# Patient Record
Sex: Male | Born: 1970 | Race: White | Hispanic: No | Marital: Married | Smoking: Current every day smoker
Health system: Southern US, Community
[De-identification: ages and names within clinical notes are randomized; demographics above are authoritative.]

## PROBLEM LIST (undated history)

## (undated) DIAGNOSIS — L409 Psoriasis, unspecified: Secondary | ICD-10-CM

## (undated) DIAGNOSIS — I10 Essential (primary) hypertension: Secondary | ICD-10-CM

## (undated) DIAGNOSIS — F419 Anxiety disorder, unspecified: Secondary | ICD-10-CM

---

## 2008-11-09 ENCOUNTER — Ambulatory Visit (HOSPITAL_COMMUNITY): Admission: RE | Admit: 2008-11-09 | Discharge: 2008-11-09 | Payer: Self-pay | Admitting: Dermatology

## 2009-12-18 ENCOUNTER — Emergency Department (HOSPITAL_BASED_OUTPATIENT_CLINIC_OR_DEPARTMENT_OTHER): Admission: EM | Admit: 2009-12-18 | Discharge: 2009-12-18 | Payer: Self-pay | Admitting: Emergency Medicine

## 2009-12-18 ENCOUNTER — Ambulatory Visit: Payer: Self-pay | Admitting: Diagnostic Radiology

## 2012-01-21 ENCOUNTER — Encounter (HOSPITAL_BASED_OUTPATIENT_CLINIC_OR_DEPARTMENT_OTHER): Payer: Self-pay | Admitting: *Deleted

## 2012-01-21 ENCOUNTER — Emergency Department (HOSPITAL_BASED_OUTPATIENT_CLINIC_OR_DEPARTMENT_OTHER): Payer: Worker's Compensation

## 2012-01-21 ENCOUNTER — Emergency Department (HOSPITAL_BASED_OUTPATIENT_CLINIC_OR_DEPARTMENT_OTHER)
Admission: EM | Admit: 2012-01-21 | Discharge: 2012-01-21 | Disposition: A | Discharge status: Home / Self Care | Payer: Worker's Compensation | Attending: Emergency Medicine | Admitting: Emergency Medicine

## 2012-01-21 DIAGNOSIS — S92919A Unspecified fracture of unspecified toe(s), initial encounter for closed fracture: Secondary | ICD-10-CM | POA: Insufficient documentation

## 2012-01-21 DIAGNOSIS — W230XXA Caught, crushed, jammed, or pinched between moving objects, initial encounter: Secondary | ICD-10-CM | POA: Insufficient documentation

## 2012-01-21 DIAGNOSIS — S92901A Unspecified fracture of right foot, initial encounter for closed fracture: Secondary | ICD-10-CM

## 2012-01-21 DIAGNOSIS — F17200 Nicotine dependence, unspecified, uncomplicated: Secondary | ICD-10-CM | POA: Insufficient documentation

## 2012-01-21 DIAGNOSIS — I10 Essential (primary) hypertension: Secondary | ICD-10-CM | POA: Insufficient documentation

## 2012-01-21 DIAGNOSIS — S92309A Fracture of unspecified metatarsal bone(s), unspecified foot, initial encounter for closed fracture: Secondary | ICD-10-CM | POA: Insufficient documentation

## 2012-01-21 DIAGNOSIS — R110 Nausea: Secondary | ICD-10-CM | POA: Insufficient documentation

## 2012-01-21 DIAGNOSIS — S91309A Unspecified open wound, unspecified foot, initial encounter: Secondary | ICD-10-CM | POA: Insufficient documentation

## 2012-01-21 HISTORY — DX: Essential (primary) hypertension: I10

## 2012-01-21 HISTORY — DX: Anxiety disorder, unspecified: F41.9

## 2012-01-21 MED ORDER — LIDOCAINE-EPINEPHRINE 2 %-1:100000 IJ SOLN
INTRAMUSCULAR | Status: AC
  Administered 2012-01-21: 1 mL
  Filled 2012-01-21: qty 1

## 2012-01-21 MED ORDER — IBUPROFEN 600 MG PO TABS: 600.0000 mg | ORAL_TABLET | Freq: Four times a day (QID) | ORAL | Status: AC | PRN

## 2012-01-21 MED ORDER — LIDOCAINE-EPINEPHRINE 2 %-1:100000 IJ SOLN
2.0000 mL | Freq: Once | INTRAMUSCULAR | Status: AC
  Administered 2012-01-21: 1 mL

## 2012-01-21 MED ORDER — KETOROLAC TROMETHAMINE 30 MG/ML IJ SOLN
30.0000 mg | Freq: Once | INTRAMUSCULAR | Status: AC
  Administered 2012-01-21: 30 mg via INTRAMUSCULAR
  Filled 2012-01-21: qty 1

## 2012-01-21 MED ORDER — OXYCODONE-ACETAMINOPHEN 5-325 MG PO TABS: 1.0000 | ORAL_TABLET | ORAL | Status: AC | PRN

## 2012-01-21 NOTE — ED Provider Notes (Signed)
History     CSN: [id number]  Arrival date & time 01/21/12  1801   First MD Initiated Contact with Patient 01/21/12 1821      Chief Complaint  Patient presents with  . Foot Injury   HPI 41 yo male who presents with right foot injury. Was moving a piano at work, which fell on his foot. He moved foot from underneath it. Was initially able to put weight on foot and walk on his heels. Corner of piano ripped open his tennis shoe and created gash on top of foot with active bleeding. Had some nausea, no vomiting. Some lightheadedness.  Pain was 5-6/10. Tetanus shot was 3 years ago.  Past Medical History  Diagnosis Date  . Anxiety   . Hypertension     History reviewed. No pertinent past surgical history.  No family history on file.  History  Substance Use Topics  . Smoking status: Current Everyday Smoker -- 1.0 packs/day  . Smokeless tobacco: Not on file  . Alcohol Use: Yes     Review of Systems  All other systems reviewed and are negative.   Allergies  Review of patient's allergies indicates no known allergies.  Home Medications   Current Outpatient Rx  Name Route Sig Dispense Refill  . LISINOPRIL-HYDROCHLOROTHIAZIDE 20-12.5 MG PO TABS Oral Take 1 tablet by mouth daily.    Marland Kitchen PAROXETINE HCL ER 37.5 MG PO TB24 Oral Take 37.5 mg by mouth every morning.    . IBUPROFEN 600 MG PO TABS Oral Take 1 tablet (600 mg total) by mouth every 6 (six) hours as needed for pain. 30 tablet 0  . OXYCODONE-ACETAMINOPHEN 5-325 MG PO TABS Oral Take 1 tablet by mouth every 4 (four) hours as needed for pain. 30 tablet 0    BP 140/90  Pulse 67  Temp 98 F (36.7 C) (Oral)  Resp 20  SpO2 96%  Physical Exam  Constitutional: No distress.       Calm and pleasant gentleman.  Eyes: EOM are normal. Pupils are equal, round, and reactive to light.  Cardiovascular: Normal rate, regular rhythm and normal heart sounds.   No murmur heard. Pulmonary/Chest: Effort normal and breath sounds normal. No  respiratory distress.  Musculoskeletal:       Able to move toes. Intact ankle inversion and eversion. Intact plantarflexion and dorsiflexion. Tenderness to palpation along dorsum of foot.   Skin:       Right foot: 2.5cm laceration with moderate bleeding. Bruises noted on 4th and 5th digits. Diffuse edema along dorsum of foot.   Psychiatric: He has a normal mood and affect. His behavior is normal.    ED Course  Procedures (including critical care time)    LACERATION REPAIR at 20:00: Performed by: Marena Chancy Consent: Verbal consent obtained. Risks and benefits: risks, benefits and alternatives were discussed Patient identity confirmed: provided demographic data Time out performed prior to procedure Prepped and Draped in normal sterile fashion Wound explored Laceration Location: right dorsum of foot Laceration Length: 2.5 cm No Foreign Bodies seen or palpated Anesthesia: local infiltration Local anesthetic: lidocaine 2% 1% epinephrine Anesthetic total: 5 ml Irrigation method: syringe Amount of cleaning: standard Skin closure: 4.0 ethylon Number of sutures or staples: 5 Technique: interrupted Patient tolerance: Patient tolerated the procedure well with no immediate complications.  Labs Reviewed - No data to display Dg Foot Complete Right  01/21/2012  *RADIOLOGY REPORT*  Clinical Data: Injury, pain.  RIGHT FOOT COMPLETE - 3+ VIEW  Comparison: None.  Findings:  The patient has a fracture of the neck of the third metatarsal with minimal lateral displacement.  There are also nondisplaced fractures of the necks of the proximal phalanges of the fourth and fifth toes.  No other acute bony or joint abnormality is identified.  Small plantar calcaneal spurs noted. There is soft tissue swelling over the dorsum of the foot.  IMPRESSION: Acute fractures through the neck of the third metatarsal and necks of the proximal phalanges of the fourth and fifth toes with associated soft tissue  swelling.  Original Report Authenticated By: Bernadene Bell. D'ALESSIO, M.D.     1. Foot fracture, right       MDM  Tetanus shot up to date (3 years ago).  Toradol 30mg  IM given with improvement of foot pain.  Laceration repaired. Post repair care reviewed.  Right foot xray showing fourth and fifth phalange fracture and 3rd metatarsal fracture. Boot provided with crutches. Patient to follow up with Dr. Norton Blizzard with Sports medicine. Ibuprofen 600mg  and percocet for pain control.  Patient discharged in stable medical condition.  Marena Chancy, PGY-2 [facility]          Lonia Skinner, MD 01/21/12 [phone]

## 2012-01-21 NOTE — ED Notes (Signed)
Right foot laceration. He was moving a piano and it fell on his foot. 1" laceration noted. Pressure to control the bleeding on arrival to the ED.

## 2012-01-22 NOTE — ED Provider Notes (Signed)
I saw and evaluated the patient, reviewed the resident's note and I agree with the findings and plan.   .Face to face Exam:  General:  Awake HEENT:  Atraumatic Resp:  Normal effort Abd:  Nondistended Neuro:No focal weakness Lymph: No adenopathy   Nelia Shi, MD 01/22/12 1157

## 2012-01-24 ENCOUNTER — Encounter (HOSPITAL_BASED_OUTPATIENT_CLINIC_OR_DEPARTMENT_OTHER): Payer: Self-pay | Admitting: Emergency Medicine

## 2012-01-24 ENCOUNTER — Emergency Department (HOSPITAL_BASED_OUTPATIENT_CLINIC_OR_DEPARTMENT_OTHER)
Admission: EM | Admit: 2012-01-24 | Discharge: 2012-01-24 | Disposition: A | Discharge status: Home / Self Care | Payer: Worker's Compensation | Attending: Emergency Medicine | Admitting: Emergency Medicine

## 2012-01-24 DIAGNOSIS — L089 Local infection of the skin and subcutaneous tissue, unspecified: Secondary | ICD-10-CM | POA: Insufficient documentation

## 2012-01-24 DIAGNOSIS — I10 Essential (primary) hypertension: Secondary | ICD-10-CM | POA: Insufficient documentation

## 2012-01-24 DIAGNOSIS — T148XXA Other injury of unspecified body region, initial encounter: Secondary | ICD-10-CM | POA: Insufficient documentation

## 2012-01-24 DIAGNOSIS — W208XXA Other cause of strike by thrown, projected or falling object, initial encounter: Secondary | ICD-10-CM | POA: Insufficient documentation

## 2012-01-24 DIAGNOSIS — F17200 Nicotine dependence, unspecified, uncomplicated: Secondary | ICD-10-CM | POA: Insufficient documentation

## 2012-01-24 HISTORY — DX: Psoriasis, unspecified: L40.9

## 2012-01-24 MED ORDER — SULFAMETHOXAZOLE-TMP DS 800-160 MG PO TABS
1.0000 | ORAL_TABLET | Freq: Once | ORAL | Status: AC
  Administered 2012-01-24: 1 via ORAL
  Filled 2012-01-24: qty 1

## 2012-01-24 MED ORDER — CEPHALEXIN 500 MG PO CAPS: 500.0000 mg | ORAL_CAPSULE | Freq: Four times a day (QID) | ORAL | Status: AC

## 2012-01-24 MED ORDER — CEPHALEXIN 250 MG PO CAPS
500.0000 mg | ORAL_CAPSULE | Freq: Once | ORAL | Status: AC
  Administered 2012-01-24: 500 mg via ORAL
  Filled 2012-01-24: qty 2

## 2012-01-24 MED ORDER — SULFAMETHOXAZOLE-TRIMETHOPRIM 800-160 MG PO TABS: 1.0000 | ORAL_TABLET | Freq: Two times a day (BID) | ORAL | Status: AC

## 2012-01-24 NOTE — ED Provider Notes (Signed)
History     CSN: [id number]  Arrival date & time 01/24/12  0940   First MD Initiated Contact with Patient 01/24/12 1014      Chief Complaint  Patient presents with  . Wound Check    (Consider location/radiation/quality/duration/timing/severity/associated sxs/prior treatment) HPI Comments: Patient presents with a recheck of the room where he's noted some drainage from the wound. He was here 3 days ago after sustaining an injury to his right foot. He dropped a piano on his foot which resulted in a laceration to the dorsum of his foot as well as fractures of the neck of the third metatarsal bone and the fourth and fifth toes. He states since yesterday he's noted some increased mostly bloody but also some increased clear drainage from the laceration repair. He denies any fevers. He states he has not been bearing weight on the foot and has a followup appointment with Dr. Pearletha Forge here in med [facility] tomorrow  The history is provided by the patient.    Past Medical History  Diagnosis Date  . Anxiety   . Hypertension   . Psoriasis     No past surgical history on file.  No family history on file.  History  Substance Use Topics  . Smoking status: Current Everyday Smoker -- 1.0 packs/day  . Smokeless tobacco: Not on file  . Alcohol Use: Yes      Review of Systems  Constitutional: Negative for fever.  HENT: Negative for neck pain.   Gastrointestinal: Negative for nausea and vomiting.  Musculoskeletal: Positive for joint swelling. Negative for back pain.  Skin: Positive for wound.    Allergies  Review of patient's allergies indicates no known allergies.  Home Medications   Current Outpatient Rx  Name Route Sig Dispense Refill  . CEPHALEXIN 500 MG PO CAPS Oral Take 1 capsule (500 mg total) by mouth 4 (four) times daily. 20 capsule 0  . IBUPROFEN 600 MG PO TABS Oral Take 1 tablet (600 mg total) by mouth every 6 (six) hours as needed for pain. 30 tablet 0  .  LISINOPRIL-HYDROCHLOROTHIAZIDE 20-12.5 MG PO TABS Oral Take 1 tablet by mouth daily.    . OXYCODONE-ACETAMINOPHEN 5-325 MG PO TABS Oral Take 1 tablet by mouth every 4 (four) hours as needed for pain. 30 tablet 0  . PAROXETINE HCL ER 37.5 MG PO TB24 Oral Take 37.5 mg by mouth every morning.    . SULFAMETHOXAZOLE-TRIMETHOPRIM 800-160 MG PO TABS Oral Take 1 tablet by mouth every 12 (twelve) hours. 20 tablet 0    BP 153/110  Pulse 64  Temp 98 F (36.7 C)  Resp 16  SpO2 97%  Physical Exam  Constitutional: He is oriented to person, place, and time. He appears well-developed and well-nourished.  Cardiovascular: Normal rate.   Pulmonary/Chest: Effort normal.  Musculoskeletal:       Patient has moderate swelling to the dorsum of the right foot with diffuse ecchymosis to the foot. He has a proximally 2 cm laceration over the mid dorsum of the right foot. There is some serous sanguinous drainage with some mild swelling around the laceration. There is no erythema or warmth to the area. No induration or fluctuance. He has normal sensation in the foot. He is able to wiggle the toes but he has pain with movement of the foot  Neurological: He is alert and oriented to person, place, and time.  Skin: Skin is warm and dry.    ED Course  Procedures (including critical  care time)  Labs Reviewed - No data to display No results found.   1. Wound infection       MDM  There is concern for early wound infection. I don't see any purulent drainage or fluctuance under the wound. The wound appears to be more proximal than actual fracture  of the third metatarsal bone which would make me have a lower suspicion for it being an open fracture. I will go ahead and start him on Keflex and Bactrim he has appointment to followup with Dr. Pearletha Forge tomorrow for recheck. At this point I did not open the sutures I felt that we would give it a trial with some antibiotics and close followup. He will continue to wear his  postoperative boot provided for him on his previous visit and remain nonweightbearing. His tetanus is up-to-date        Rolan Bucco, MD 01/24/12 1039

## 2012-01-24 NOTE — ED Notes (Signed)
Pt wants wound rechecked, noted drainage.

## 2012-01-25 ENCOUNTER — Ambulatory Visit (INDEPENDENT_AMBULATORY_CARE_PROVIDER_SITE_OTHER): Payer: Worker's Compensation | Admitting: Family Medicine

## 2012-01-25 ENCOUNTER — Encounter: Payer: Self-pay | Admitting: Family Medicine

## 2012-01-25 VITALS — BP 165/126 | HR 86 | Ht 72.0 in | Wt 204.0 lb

## 2012-01-25 DIAGNOSIS — S99921A Unspecified injury of right foot, initial encounter: Secondary | ICD-10-CM

## 2012-01-25 DIAGNOSIS — S8990XA Unspecified injury of unspecified lower leg, initial encounter: Secondary | ICD-10-CM

## 2012-01-25 NOTE — Patient Instructions (Addendum)
You have fractures of your 3rd metatarsal as well as your 4th and 5th toes. These should heal up well with conservative care though typically take 6 weeks to do so. Elevate above the level of your heart as much as possible. Change dressing 1-2 times a day. Ice area as needed 15 minutes at a time 3-4 times a day. Percocet as needed for severe pain. Post op shoe or cam walker when up and walking around. Crutches to help with walking - ok to bear weight when tolerated. Follow up with me on 8/29 or a couple days sooner to remove stitches and repeat x-rays. Call with any questions.

## 2012-01-26 ENCOUNTER — Encounter: Payer: Self-pay | Admitting: Family Medicine

## 2012-01-26 DIAGNOSIS — S99921A Unspecified injury of right foot, initial encounter: Secondary | ICD-10-CM | POA: Insufficient documentation

## 2012-01-26 NOTE — Progress Notes (Signed)
Subjective:    Patient ID: Nathaniel Duran, male    DOB: Jun 20, 1970, 41 y.o.   MRN: [id number]  PCP: Dr. Kevan Ny  HPI 41 yo M here for right foot injury.  Patient reports on 8/15 while moving a piano at work this was accidentally dropped onto his right foot. Caused a laceration - went to ED had stitches placed and radiographs showed minimally displaced 3rd metatarsal neck, nondisplaced 4th-5th proximal phalanx fractures. Wrapped with gauze, ace wrap and postop shoe given. Using crutches to help with ambulation. Given percocet but not taking - taking ibuprofen as needed. Went back to ED with increased swelling - given bactrim and keflex. Has been icing as well.  Past Medical History  Diagnosis Date  . Anxiety   . Hypertension   . Psoriasis     Current Outpatient Prescriptions on File Prior to Visit  Medication Sig Dispense Refill  . cephALEXin (KEFLEX) 500 MG capsule Take 1 capsule (500 mg total) by mouth 4 (four) times daily.  20 capsule  0  . ibuprofen (ADVIL,MOTRIN) 600 MG tablet Take 1 tablet (600 mg total) by mouth every 6 (six) hours as needed for pain.  30 tablet  0  . lisinopril (PRINIVIL,ZESTRIL) 20 MG tablet       . oxyCODONE-acetaminophen (ROXICET) 5-325 MG per tablet Take 1 tablet by mouth every 4 (four) hours as needed for pain.  30 tablet  0  . PARoxetine (PAXIL-CR) 37.5 MG 24 hr tablet Take 37.5 mg by mouth every morning.      . sulfamethoxazole-trimethoprim (SEPTRA DS) 800-160 MG per tablet Take 1 tablet by mouth every 12 (twelve) hours.  20 tablet  0    History reviewed. No pertinent past surgical history.  No Known Allergies  History   Social History  . Marital Status: Single    Spouse Name: N/A    Number of Children: N/A  . Years of Education: N/A   Occupational History  . Not on file.   Social History Main Topics  . Smoking status: Current Everyday Smoker -- 1.0 packs/day  . Smokeless tobacco: Not on file  . Alcohol Use: Yes  . Drug Use: No  .  Sexually Active: Not on file   Other Topics Concern  . Not on file   Social History Narrative  . No narrative on file    Family History  Problem Relation Age of Onset  . Sudden death Neg Hx   . Hypertension Neg Hx   . Hyperlipidemia Neg Hx   . Heart attack Neg Hx   . Diabetes Neg Hx     BP 165/126  Pulse 86  Ht 6' (1.829 m)  Wt 204 lb (92.534 kg)  BMI 27.67 kg/m2  Review of Systems See HPI above.    Objective:   Physical Exam Gen: NAD  R foot: Significant swelling, bruising dorsal foot into digits.  Sutures in place without separation.  No warmth, purulence, erythema.  Bloody drainage from wound. TTP throughout dorsal foot including 4th and 5th digits mildly - feels 'numb' per patient. Mild limitation ankle ROM all directions.  Able to flex and extend digits mildly. Cap refill < 2 seconds all digits.    Assessment & Plan:  1. Right foot injury - minimally displaced 3rd metatarsal neck and 4th, 5th proximal phalanx fractures.  These should heal very well with conservative care.  Cam walker for help with ambulation, crutches.  Weight bearing when tolerated - expect this to take about 4 weeks  to feel comfortable walking on foot.  Total 6 weeks to heal.  F/u when 2 weeks out - out of work for now and believe it will be 4 weeks before he can return.  Will remove stitches at f/u.

## 2012-01-26 NOTE — Assessment & Plan Note (Signed)
minimally displaced 3rd metatarsal neck and 4th, 5th proximal phalanx fractures.  These should heal very well with conservative care.  Cam walker for help with ambulation, crutches.  Weight bearing when tolerated - expect this to take about 4 weeks to feel comfortable walking on foot.  Total 6 weeks to heal.  F/u when 2 weeks out - out of work for now and believe it will be 4 weeks before he can return.  Will remove stitches at f/u.

## 2012-02-04 ENCOUNTER — Ambulatory Visit (INDEPENDENT_AMBULATORY_CARE_PROVIDER_SITE_OTHER): Payer: Worker's Compensation | Admitting: Family Medicine

## 2012-02-04 ENCOUNTER — Ambulatory Visit (HOSPITAL_BASED_OUTPATIENT_CLINIC_OR_DEPARTMENT_OTHER)
Admission: RE | Admit: 2012-02-04 | Discharge: 2012-02-04 | Disposition: A | Discharge status: Home / Self Care | Payer: Worker's Compensation | Source: Ambulatory Visit | Attending: Family Medicine | Admitting: Family Medicine

## 2012-02-04 ENCOUNTER — Encounter: Payer: Self-pay | Admitting: Family Medicine

## 2012-02-04 VITALS — BP 136/93 | HR 71 | Ht 72.0 in | Wt 204.0 lb

## 2012-02-04 DIAGNOSIS — X58XXXA Exposure to other specified factors, initial encounter: Secondary | ICD-10-CM | POA: Insufficient documentation

## 2012-02-04 DIAGNOSIS — S8990XA Unspecified injury of unspecified lower leg, initial encounter: Secondary | ICD-10-CM

## 2012-02-04 DIAGNOSIS — S92309A Fracture of unspecified metatarsal bone(s), unspecified foot, initial encounter for closed fracture: Secondary | ICD-10-CM | POA: Insufficient documentation

## 2012-02-04 DIAGNOSIS — S99921A Unspecified injury of right foot, initial encounter: Secondary | ICD-10-CM

## 2012-02-07 ENCOUNTER — Encounter: Payer: Self-pay | Admitting: Family Medicine

## 2012-02-07 NOTE — Progress Notes (Signed)
  Subjective:    Patient ID: Nathaniel Duran, male    DOB: 01/03/1971, 41 y.o.   MRN: [id number]  PCP: Dr. Kevan Ny  HPI  41 yo M here for f/u right foot injury.  8/19: Patient reports on 8/15 while moving a piano at work this was accidentally dropped onto his right foot. Caused a laceration - went to ED had stitches placed and radiographs showed minimally displaced 3rd metatarsal neck, nondisplaced 4th-5th proximal phalanx fractures. Wrapped with gauze, ace wrap and postop shoe given. Using crutches to help with ambulation. Given percocet but not taking - taking ibuprofen as needed. Went back to ED with increased swelling - given bactrim and keflex. Has been icing as well.  8/29: Overall patient doing well. Swelling has decreased in right foot. Using walking boot, crutches. Taking ibuprofen as needed for pain. Finished antibiotics.  Past Medical History  Diagnosis Date  . Anxiety   . Hypertension   . Psoriasis     Current Outpatient Prescriptions on File Prior to Visit  Medication Sig Dispense Refill  . lisinopril (PRINIVIL,ZESTRIL) 20 MG tablet       . PARoxetine (PAXIL-CR) 37.5 MG 24 hr tablet Take 37.5 mg by mouth every morning.        History reviewed. No pertinent past surgical history.  No Known Allergies  History   Social History  . Marital Status: Single    Spouse Name: N/A    Number of Children: N/A  . Years of Education: N/A   Occupational History  . Not on file.   Social History Main Topics  . Smoking status: Current Everyday Smoker -- 1.0 packs/day  . Smokeless tobacco: Not on file  . Alcohol Use: Yes  . Drug Use: No  . Sexually Active: Not on file   Other Topics Concern  . Not on file   Social History Narrative  . No narrative on file    Family History  Problem Relation Age of Onset  . Sudden death Neg Hx   . Hypertension Neg Hx   . Hyperlipidemia Neg Hx   . Heart attack Neg Hx   . Diabetes Neg Hx     BP 136/93  Pulse 71  Ht 6'  (1.829 m)  Wt 204 lb (92.534 kg)  BMI 27.67 kg/m2  Review of Systems  See HPI above.    Objective:   Physical Exam  Gen: NAD  R foot: Mild-mod swelling, mild bruising dorsal foot into digits.  Sutures in place without separation.  No warmth, purulence, erythema.  No drainage from wound. TTP throughout dorsal foot including 4th and 5th digits mildly - feels 'numb' per patient. Mild limitation ankle ROM all directions.  Able to flex and extend digits mildly. Cap refill < 2 seconds all digits.    Assessment & Plan:  1. Right foot injury - minimally displaced 3rd metatarsal neck and 4th, 5th proximal phalanx fractures.  Radiographs show some movement of 3rd MT neck fracture but overall appears as expected.  Sutures removed today.  Continue cam walker, crutches.  F/u in 2 weeks for reevaluation, repeat radiographs.

## 2012-02-07 NOTE — Assessment & Plan Note (Signed)
minimally displaced 3rd metatarsal neck and 4th, 5th proximal phalanx fractures.  Radiographs show some movement of 3rd MT neck fracture but overall appears as expected.  Sutures removed today.  Continue cam walker, crutches.  F/u in 2 weeks for reevaluation, repeat radiographs.

## 2012-02-17 ENCOUNTER — Encounter: Payer: Self-pay | Admitting: Family Medicine

## 2012-02-17 ENCOUNTER — Ambulatory Visit (HOSPITAL_BASED_OUTPATIENT_CLINIC_OR_DEPARTMENT_OTHER)
Admission: RE | Admit: 2012-02-17 | Discharge: 2012-02-17 | Disposition: A | Discharge status: Home / Self Care | Payer: Worker's Compensation | Source: Ambulatory Visit | Attending: Family Medicine | Admitting: Family Medicine

## 2012-02-17 ENCOUNTER — Ambulatory Visit (INDEPENDENT_AMBULATORY_CARE_PROVIDER_SITE_OTHER): Payer: Worker's Compensation | Admitting: Family Medicine

## 2012-02-17 VITALS — BP 127/87 | HR 91 | Ht 72.0 in | Wt 204.0 lb

## 2012-02-17 DIAGNOSIS — S92919A Unspecified fracture of unspecified toe(s), initial encounter for closed fracture: Secondary | ICD-10-CM | POA: Insufficient documentation

## 2012-02-17 DIAGNOSIS — S8990XA Unspecified injury of unspecified lower leg, initial encounter: Secondary | ICD-10-CM

## 2012-02-17 DIAGNOSIS — X58XXXA Exposure to other specified factors, initial encounter: Secondary | ICD-10-CM | POA: Insufficient documentation

## 2012-02-17 DIAGNOSIS — S99919A Unspecified injury of unspecified ankle, initial encounter: Secondary | ICD-10-CM

## 2012-02-17 DIAGNOSIS — S99921A Unspecified injury of right foot, initial encounter: Secondary | ICD-10-CM

## 2012-02-18 ENCOUNTER — Ambulatory Visit: Payer: Self-pay | Admitting: Family Medicine

## 2012-02-19 ENCOUNTER — Encounter: Payer: Self-pay | Admitting: Family Medicine

## 2012-02-19 NOTE — Assessment & Plan Note (Signed)
minimally displaced 3rd metatarsal neck and 4th, 5th proximal phalanx fractures - also tuft fracture of 5th digit.  No further displacement of 3rd MT and no evidence AVN.  Clinically improving as expected.  Advised to start transitioning off crutches, use postop shoe or boot for another 2 weeks - out of work next 2 weeks.  F/u in 2 weeks for reevaluation, repeat radiographs.

## 2012-02-19 NOTE — Progress Notes (Signed)
  Subjective:    Patient ID: Nathaniel Duran, male    DOB: [birth year], 41 y.o.   MRN: [id number]  PCP: Dr. Kevan Ny  HPI  41 yo M here for f/u right foot injury.  8/19: Patient reports on 8/15 while moving a piano at work this was accidentally dropped onto his right foot. Caused a laceration - went to ED had stitches placed and radiographs showed minimally displaced 3rd metatarsal neck, nondisplaced 4th-5th proximal phalanx fractures. Wrapped with gauze, ace wrap and postop shoe given. Using crutches to help with ambulation. Given percocet but not taking - taking ibuprofen as needed. Went back to ED with increased swelling - given bactrim and keflex. Has been icing as well.  8/29: Overall patient doing well. Swelling has decreased in right foot. Using walking boot, crutches. Taking ibuprofen as needed for pain. Finished antibiotics.  9/11: Patient reports no pain. Using walking boot and crutches. Uses postop shoe in the middle of the night. Occasionally takes ibuprofen. Has been icing and elevating.  Past Medical History  Diagnosis Date  . Anxiety   . Hypertension   . Psoriasis     Current Outpatient Prescriptions on File Prior to Visit  Medication Sig Dispense Refill  . lisinopril (PRINIVIL,ZESTRIL) 20 MG tablet       . PARoxetine (PAXIL-CR) 37.5 MG 24 hr tablet Take 37.5 mg by mouth every morning.        History reviewed. No pertinent past surgical history.  No Known Allergies  History   Social History  . Marital Status: Single    Spouse Name: N/A    Number of Children: N/A  . Years of Education: N/A   Occupational History  . Not on file.   Social History Main Topics  . Smoking status: Current Every Day Smoker -- 1.0 packs/day  . Smokeless tobacco: Not on file  . Alcohol Use: Yes  . Drug Use: No  . Sexually Active: Not on file   Other Topics Concern  . Not on file   Social History Narrative  . No narrative on file    Family History  Problem  Relation Age of Onset  . Sudden death Neg Hx   . Hypertension Neg Hx   . Hyperlipidemia Neg Hx   . Heart attack Neg Hx   . Diabetes Neg Hx     BP 127/87  Pulse 91  Ht 6' (1.829 m)  Wt 204 lb (92.534 kg)  BMI 27.67 kg/m2  Review of Systems  See HPI above.    Objective:   Physical Exam  Gen: NAD  R foot: Mild swelling, no bruising dorsal foot into digits.  Lacerations healing well without erythema, purulence, erythema.  No drainage from wound. Minimal TTP 3rd MT.  No TTP 4th, 5th digits, elsewhere foot ankle. Mild limitation ankle ROM all directions.  Able to flex and extend digits mildly. Cap refill < 2 seconds all digits.    Assessment & Plan:  1. Right foot injury - minimally displaced 3rd metatarsal neck and 4th, 5th proximal phalanx fractures - also tuft fracture of 5th digit.  No further displacement of 3rd MT and no evidence AVN.  Clinically improving as expected.  Advised to start transitioning off crutches, use postop shoe or boot for another 2 weeks - out of work next 2 weeks.  F/u in 2 weeks for reevaluation, repeat radiographs.

## 2012-03-02 ENCOUNTER — Encounter: Payer: Self-pay | Admitting: Family Medicine

## 2012-03-02 ENCOUNTER — Ambulatory Visit (INDEPENDENT_AMBULATORY_CARE_PROVIDER_SITE_OTHER): Payer: Worker's Compensation | Admitting: Family Medicine

## 2012-03-02 ENCOUNTER — Ambulatory Visit (HOSPITAL_BASED_OUTPATIENT_CLINIC_OR_DEPARTMENT_OTHER)
Admission: RE | Admit: 2012-03-02 | Discharge: 2012-03-02 | Disposition: A | Discharge status: Home / Self Care | Payer: Worker's Compensation | Source: Ambulatory Visit | Attending: Family Medicine | Admitting: Family Medicine

## 2012-03-02 VITALS — BP 155/115 | HR 74 | Ht 72.0 in | Wt 204.0 lb

## 2012-03-02 DIAGNOSIS — S92309A Fracture of unspecified metatarsal bone(s), unspecified foot, initial encounter for closed fracture: Secondary | ICD-10-CM | POA: Insufficient documentation

## 2012-03-02 DIAGNOSIS — S99921A Unspecified injury of right foot, initial encounter: Secondary | ICD-10-CM

## 2012-03-02 DIAGNOSIS — S92919A Unspecified fracture of unspecified toe(s), initial encounter for closed fracture: Secondary | ICD-10-CM | POA: Insufficient documentation

## 2012-03-02 DIAGNOSIS — X58XXXA Exposure to other specified factors, initial encounter: Secondary | ICD-10-CM | POA: Insufficient documentation

## 2012-03-02 DIAGNOSIS — S99919A Unspecified injury of unspecified ankle, initial encounter: Secondary | ICD-10-CM

## 2012-03-02 DIAGNOSIS — S8990XA Unspecified injury of unspecified lower leg, initial encounter: Secondary | ICD-10-CM

## 2012-03-03 ENCOUNTER — Encounter: Payer: Self-pay | Admitting: Family Medicine

## 2012-03-03 NOTE — Assessment & Plan Note (Signed)
minimally displaced 3rd metatarsal neck and 4th, 5th proximal phalanx fractures - also tuft fracture of 5th digit.  No further displacement of 3rd MT and no evidence AVN.  Again with mild improvement on radiographs but not significantly changed from 2 weeks ago.  Clinically much better though and able to fully weight bear.  Off of crutches now.  Advised he can now switch to well supported shoe if tolerated.  Will keep out of work for another 2 weeks while he builds up endurance, ensure he can weight bear in regular shoe.  If does well anticipate returning to work in 2 weeks at least on light duty.  F/u in 2 weeks.  Consider radiographs if not continuing to improve.

## 2012-03-03 NOTE — Progress Notes (Signed)
Subjective:    Patient ID: Nathaniel Duran, male    DOB: 24-Jan-1971, 41 y.o.   MRN: [id number]  PCP: Dr. Kevan Ny  HPI  41 yo M here for f/u right foot injury.  8/19: Patient reports on 8/15 while moving a piano at work this was accidentally dropped onto his right foot. Caused a laceration - went to ED had stitches placed and radiographs showed minimally displaced 3rd metatarsal neck, nondisplaced 4th-5th proximal phalanx fractures. Wrapped with gauze, ace wrap and postop shoe given. Using crutches to help with ambulation. Given percocet but not taking - taking ibuprofen as needed. Went back to ED with increased swelling - given bactrim and keflex. Has been icing as well.  8/29: Overall patient doing well. Swelling has decreased in right foot. Using walking boot, crutches. Taking ibuprofen as needed for pain. Finished antibiotics.  9/11: Patient reports no pain. Using walking boot and crutches. Uses postop shoe in the middle of the night. Occasionally takes ibuprofen. Has been icing and elevating.  9/25: Patient now able to put full weight on foot in postop shoe and walking boot. No longer using crutches. Taking ibuprofen as needed. Still some swelling but better. Gets tired if on feet more than an hour.  Past Medical History  Diagnosis Date  . Anxiety   . Hypertension   . Psoriasis     Current Outpatient Prescriptions on File Prior to Visit  Medication Sig Dispense Refill  . lisinopril (PRINIVIL,ZESTRIL) 20 MG tablet       . PARoxetine (PAXIL-CR) 37.5 MG 24 hr tablet Take 37.5 mg by mouth every morning.        History reviewed. No pertinent past surgical history.  No Known Allergies  History   Social History  . Marital Status: Single    Spouse Name: N/A    Number of Children: N/A  . Years of Education: N/A   Occupational History  . Not on file.   Social History Main Topics  . Smoking status: Current Every Day Smoker -- 1.0 packs/day  . Smokeless  tobacco: Not on file  . Alcohol Use: Yes  . Drug Use: No  . Sexually Active: Not on file   Other Topics Concern  . Not on file   Social History Narrative  . No narrative on file    Family History  Problem Relation Age of Onset  . Sudden death Neg Hx   . Hypertension Neg Hx   . Hyperlipidemia Neg Hx   . Heart attack Neg Hx   . Diabetes Neg Hx     BP 155/115  Pulse 74  Ht 6' (1.829 m)  Wt 204 lb (92.534 kg)  BMI 27.67 kg/m2  Review of Systems  See HPI above.    Objective:   Physical Exam  Gen: NAD  R foot: Mild swelling but improved, no bruising dorsal foot into digits.  Lacerations continue to heal well without erythema, purulence, erythema.  No drainage. Minimal TTP 3rd and 4th MTs.  No TTP 4th, 5th digits, elsewhere foot ankle. Mild limitation ankle ROM all directions.  Able to flex and extend digits. Cap refill < 2 seconds all digits.    Assessment & Plan:  1. Right foot injury - minimally displaced 3rd metatarsal neck and 4th, 5th proximal phalanx fractures - also tuft fracture of 5th digit.  No further displacement of 3rd MT and no evidence AVN.  Again with mild improvement on radiographs but not significantly changed from 2 weeks ago.  Clinically  much better though and able to fully weight bear.  Off of crutches now.  Advised he can now switch to well supported shoe if tolerated.  Will keep out of work for another 2 weeks while he builds up endurance, ensure he can weight bear in regular shoe.  If does well anticipate returning to work in 2 weeks at least on light duty.  F/u in 2 weeks.  Consider radiographs if not continuing to improve.

## 2012-03-16 ENCOUNTER — Encounter: Payer: Self-pay | Admitting: Family Medicine

## 2012-03-16 ENCOUNTER — Ambulatory Visit (INDEPENDENT_AMBULATORY_CARE_PROVIDER_SITE_OTHER): Payer: Worker's Compensation | Admitting: Family Medicine

## 2012-03-16 VITALS — BP 142/102 | HR 78 | Ht 72.0 in | Wt 204.0 lb

## 2012-03-16 DIAGNOSIS — S99921A Unspecified injury of right foot, initial encounter: Secondary | ICD-10-CM

## 2012-03-16 DIAGNOSIS — S8990XA Unspecified injury of unspecified lower leg, initial encounter: Secondary | ICD-10-CM

## 2012-03-16 DIAGNOSIS — S99919A Unspecified injury of unspecified ankle, initial encounter: Secondary | ICD-10-CM

## 2012-03-17 ENCOUNTER — Encounter: Payer: Self-pay | Admitting: Family Medicine

## 2012-03-17 NOTE — Assessment & Plan Note (Signed)
minimally displaced 3rd metatarsal neck and 4th, 5th proximal phalanx fractures - also tuft fracture of 5th digit.  Continues to improve clinically.  Ultrasound done today to ensure callus formation which has occurred though not completely so.  Will return him to light duty -- no lifting more than 20 pounds, 15 minute break from standing/walking every 2 hours.  Form filled out.  F/u in 3-4 weeks for reevaluation.

## 2012-03-17 NOTE — Progress Notes (Signed)
Subjective:    Patient ID: Nathaniel Duran, male    DOB: 09-04-1970, 41 y.o.   MRN: [id number]  PCP: Dr. Kevan Ny  HPI  40 yo M here for f/u right foot injury.  8/19: Patient reports on 8/15 while moving a piano at work this was accidentally dropped onto his right foot. Caused a laceration - went to ED had stitches placed and radiographs showed minimally displaced 3rd metatarsal neck, nondisplaced 4th-5th proximal phalanx fractures. Wrapped with gauze, ace wrap and postop shoe given. Using crutches to help with ambulation. Given percocet but not taking - taking ibuprofen as needed. Went back to ED with increased swelling - given bactrim and keflex. Has been icing as well.  8/29: Overall patient doing well. Swelling has decreased in right foot. Using walking boot, crutches. Taking ibuprofen as needed for pain. Finished antibiotics.  9/11: Patient reports no pain. Using walking boot and crutches. Uses postop shoe in the middle of the night. Occasionally takes ibuprofen. Has been icing and elevating.  9/25: Patient now able to put full weight on foot in postop shoe and walking boot. No longer using crutches. Taking ibuprofen as needed. Still some swelling but better. Gets tired if on feet more than an hour.  10/9: Patient able to bear weight without boot or shoe for 2 hours straight but then gets sore. Not taking anything for pain currently. Swelling improved. Not using crutches. Has been out of work.  Past Medical History  Diagnosis Date  . Anxiety   . Hypertension   . Psoriasis     Current Outpatient Prescriptions on File Prior to Visit  Medication Sig Dispense Refill  . lisinopril (PRINIVIL,ZESTRIL) 20 MG tablet       . PARoxetine (PAXIL-CR) 37.5 MG 24 hr tablet Take 37.5 mg by mouth every morning.        History reviewed. No pertinent past surgical history.  No Known Allergies  History   Social History  . Marital Status: Single    Spouse Name: N/A   Number of Children: N/A  . Years of Education: N/A   Occupational History  . Not on file.   Social History Main Topics  . Smoking status: Current Every Day Smoker -- 1.0 packs/day  . Smokeless tobacco: Not on file  . Alcohol Use: Yes  . Drug Use: No  . Sexually Active: Not on file   Other Topics Concern  . Not on file   Social History Narrative  . No narrative on file    Family History  Problem Relation Age of Onset  . Sudden death Neg Hx   . Hypertension Neg Hx   . Hyperlipidemia Neg Hx   . Heart attack Neg Hx   . Diabetes Neg Hx     BP 142/102  Pulse 78  Ht 6' (1.829 m)  Wt 204 lb (92.534 kg)  BMI 27.67 kg/m2  Review of Systems  See HPI above.    Objective:   Physical Exam  Gen: NAD  R foot: Minimal swelling dorsal foot.  No other deformity, bruising, other abnormalities. No TTP 3rd and 4th MTs.  No TTP 4th, 5th digits, elsewhere foot ankle. Mild limitation ankle ROM all directions.  Able to flex and extend digits. Cap refill < 2 seconds all digits.  MSK u/s - 3rd MT with callus formation at fracture site with bridging.  Neovascularity at fracture site and some swelling as well.    Assessment & Plan:  1. Right foot injury - minimally  displaced 3rd metatarsal neck and 4th, 5th proximal phalanx fractures - also tuft fracture of 5th digit.  Continues to improve clinically.  Ultrasound done today to ensure callus formation which has occurred though not completely so.  Will return him to light duty -- no lifting more than 20 pounds, 15 minute break from standing/walking every 2 hours.  Form filled out.  F/u in 3-4 weeks for reevaluation.

## 2012-04-13 ENCOUNTER — Ambulatory Visit: Payer: Self-pay | Admitting: Family Medicine

## 2014-05-11 ENCOUNTER — Ambulatory Visit
Admission: RE | Admit: 2014-05-11 | Discharge: 2014-05-11 | Disposition: A | Discharge status: Home / Self Care | Payer: BC Managed Care – PPO | Source: Ambulatory Visit | Attending: Family Medicine | Admitting: Family Medicine

## 2014-05-11 ENCOUNTER — Other Ambulatory Visit: Payer: Self-pay | Admitting: Family Medicine

## 2014-05-11 DIAGNOSIS — M25532 Pain in left wrist: Secondary | ICD-10-CM

## 2014-06-05 IMAGING — CR DG FOOT COMPLETE 3+V*R*
3 series · 3 of 3 positions shown · non-contrast
Comparison: None.

CLINICAL DATA: Injury, pain.

RIGHT FOOT COMPLETE - 3+ VIEW

[t foot ap right]
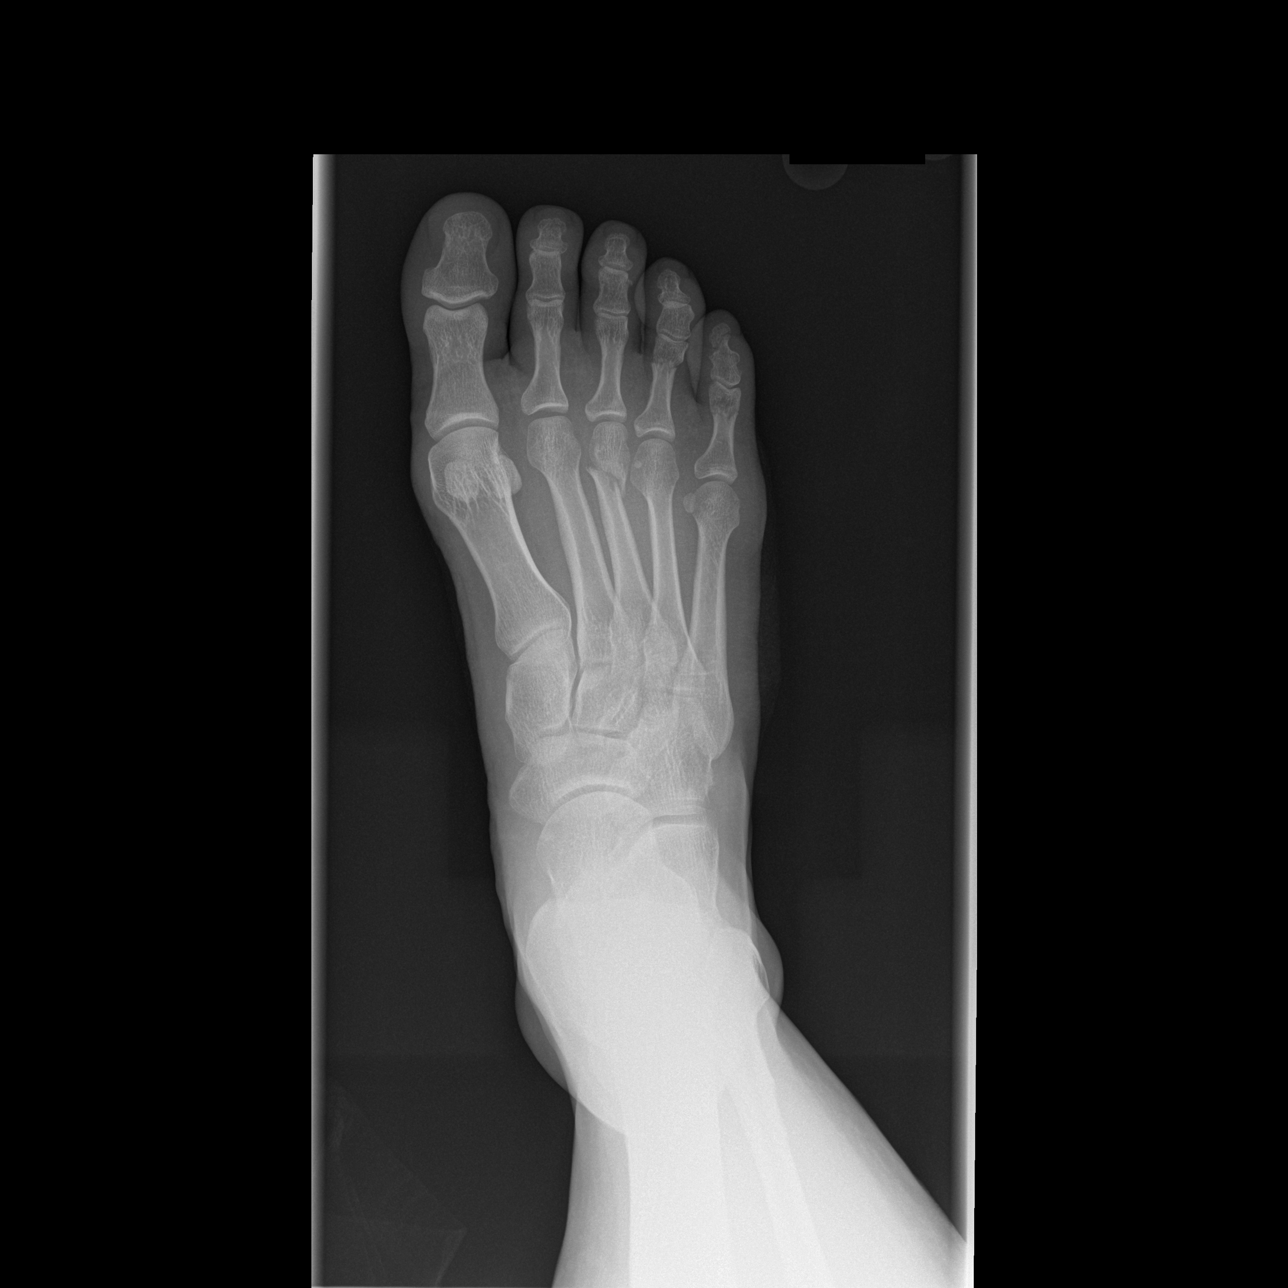

[t foot oblique right]
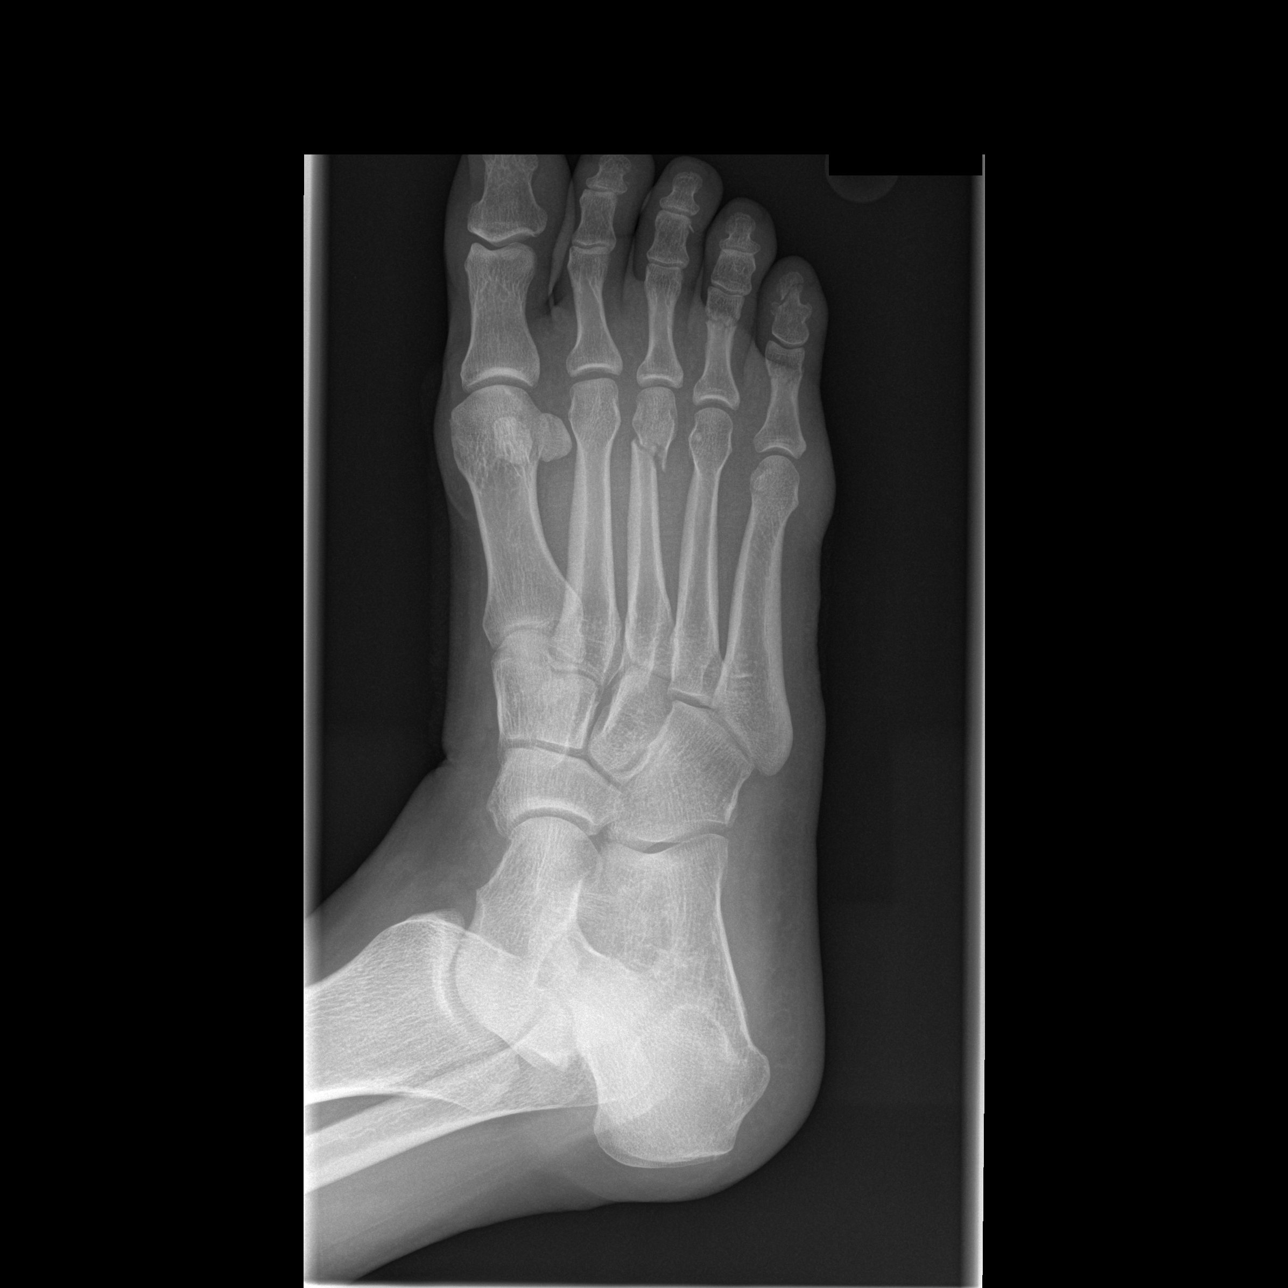

[t foot lat right]
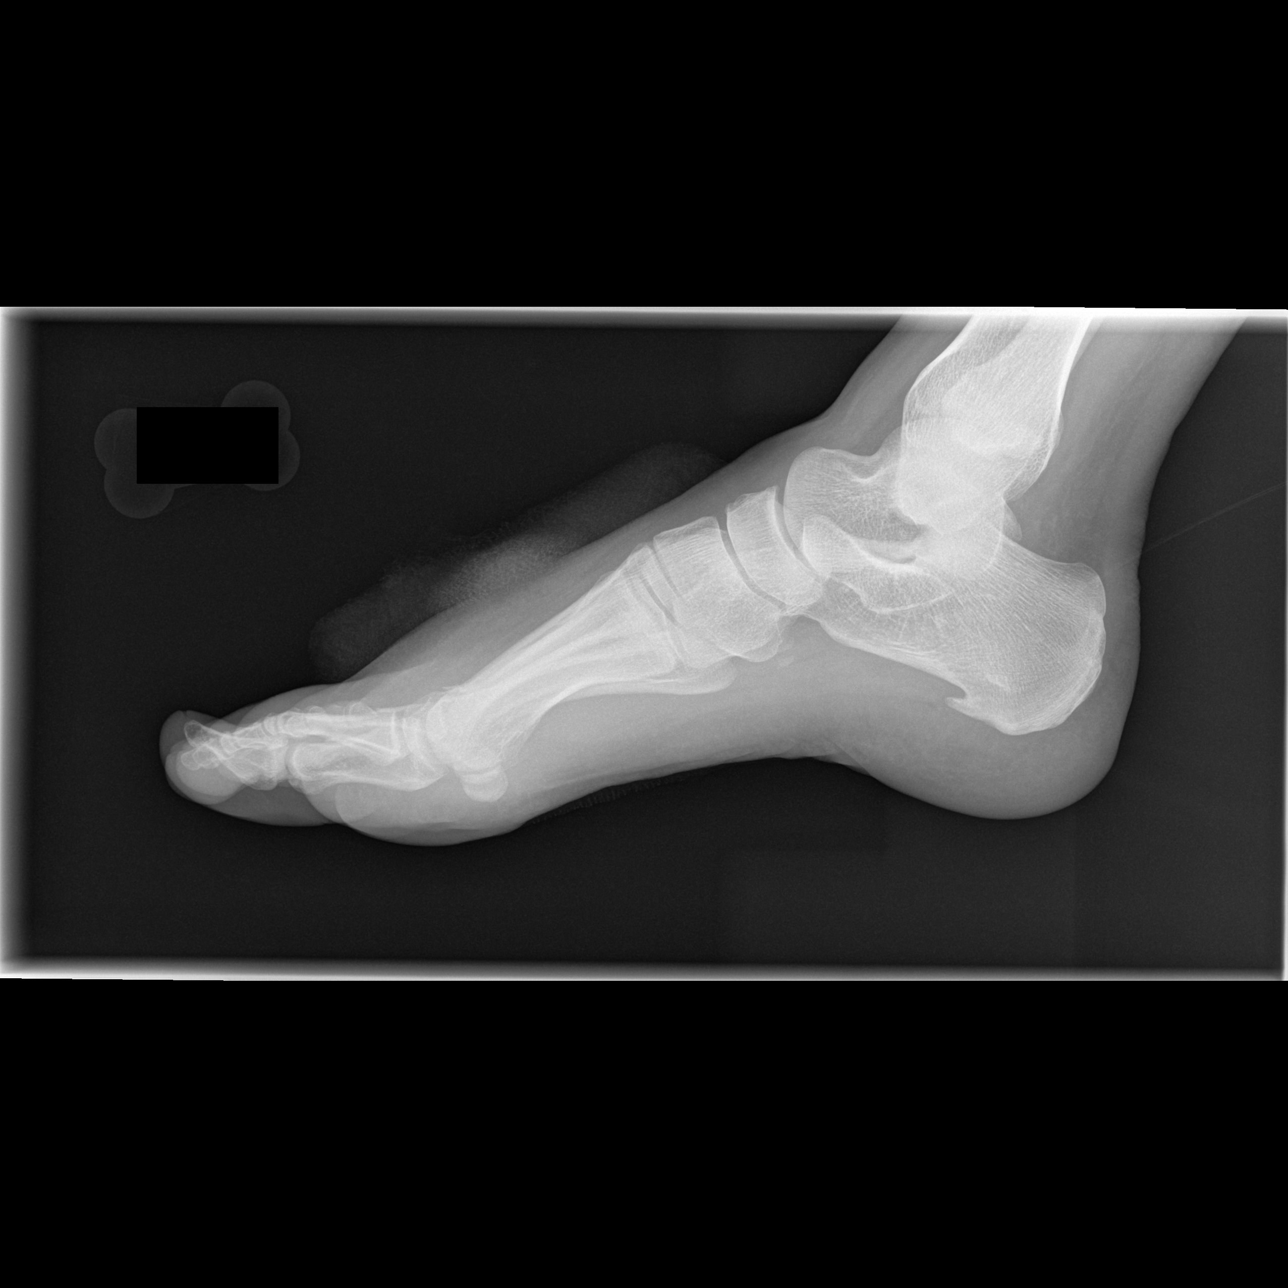

[3 of 3 positions shown; findings below may reference images not displayed]

FINDINGS: The patient has a fracture of the neck of the third
metatarsal with minimal lateral displacement.  There are also
nondisplaced fractures of the necks of the proximal phalanges of
the fourth and fifth toes.  No other acute bony or joint
abnormality is identified.  Small plantar calcaneal spurs noted.
There is soft tissue swelling over the dorsum of the foot.
IMPRESSION: Acute fractures through the neck of the third metatarsal and necks
of the proximal phalanges of the fourth and fifth toes with
associated soft tissue swelling.

## 2017-06-16 ENCOUNTER — Encounter: Payer: Self-pay | Admitting: Family Medicine

## 2017-06-16 ENCOUNTER — Ambulatory Visit: Payer: 59 | Admitting: Family Medicine

## 2017-06-16 VITALS — BP 138/80 | HR 73 | Ht 72.0 in | Wt 231.4 lb

## 2017-06-16 DIAGNOSIS — Z720 Tobacco use: Secondary | ICD-10-CM | POA: Diagnosis not present

## 2017-06-16 DIAGNOSIS — I10 Essential (primary) hypertension: Secondary | ICD-10-CM

## 2017-06-16 DIAGNOSIS — Z0000 Encounter for general adult medical examination without abnormal findings: Secondary | ICD-10-CM | POA: Insufficient documentation

## 2017-06-16 DIAGNOSIS — Z23 Encounter for immunization: Secondary | ICD-10-CM

## 2017-06-16 DIAGNOSIS — F419 Anxiety disorder, unspecified: Secondary | ICD-10-CM | POA: Diagnosis not present

## 2017-06-16 MED ORDER — LISINOPRIL 20 MG PO TABS: 20.0000 mg | ORAL_TABLET | Freq: Every day | ORAL | 1 refills | Status: AC

## 2017-06-16 MED ORDER — PAROXETINE HCL 40 MG PO TABS: 40.0000 mg | ORAL_TABLET | ORAL | 1 refills | Status: AC

## 2017-06-16 NOTE — Progress Notes (Signed)
Subjective:  Patient ID: Nathaniel Duran, male    DOB: 04-Feb-1971  Age: 47 y.o. MRN: [id number]  CC: Establish Care   HPI Nathaniel Duran presents for establishment of care and follow-up of the medical problems listed below.  He has a long-standing history of hypertension.  He recently discontinued his HCTZ.  He was concerned that it has been recall.  He is nonfasting today.  He smokes around a pack of cigarettes per day.  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  His husband is also a Energy manager and works out of the home as well.  He is planning on moving to [place] in May.  He has a history of psoriasis that has been well controlled with Humira.  History Nathaniel Duran has a past medical history of Anxiety, Hypertension, and Psoriasis.   He has no past surgical history on file.   His family history is not on file.He reports that he has been smoking.  He has been smoking about 1.00 pack per day. he has never used smokeless tobacco. He reports that he drinks alcohol. He reports that he does not use drugs.  Outpatient Medications Prior to Visit  Medication Sig Dispense Refill  . hydrochlorothiazide (HYDRODIURIL) 25 MG tablet Take 25 mg by mouth daily.    Marland Kitchen lisinopril (PRINIVIL,ZESTRIL) 20 MG tablet     . PARoxetine (PAXIL) 40 MG tablet Take 40 mg by mouth every morning.    Marland Kitchen PARoxetine (PAXIL-CR) 37.5 MG 24 hr tablet Take 37.5 mg by mouth every morning.     No facility-administered medications prior to visit.     ROS Review of Systems  Constitutional: Negative.   HENT: Negative.   Eyes: Negative.   Respiratory: Negative.   Cardiovascular: Negative.   Gastrointestinal: Negative.   Endocrine: Negative for polyphagia and polyuria.  Genitourinary: Negative.   Musculoskeletal: Negative for gait problem and myalgias.  Skin: Positive for rash. Negative for pallor.  Neurological: Negative for light-headedness and headaches.  Hematological: Does not bruise/bleed  easily.  Psychiatric/Behavioral: Negative for dysphoric mood. The patient is not nervous/anxious.     Objective:  BP 138/80 (BP Location: Left Arm, Patient Position: Sitting, Cuff Size: Normal)   Pulse 73   Ht 6' (1.829 m)   Wt 231 lb 6 oz (105 kg)   SpO2 96%   BMI 31.38 kg/m   Physical Exam  Constitutional: He is oriented to person, place, and time. He appears well-developed and well-nourished. No distress.  HENT:  Head: Normocephalic and atraumatic.  Right Ear: External ear normal.  Left Ear: External ear normal.  Nose: Nose normal.  Mouth/Throat: Oropharynx is clear and moist. No oropharyngeal exudate.  Eyes: Conjunctivae are normal. Pupils are equal, round, and reactive to light. Right eye exhibits no discharge. Left eye exhibits no discharge. No scleral icterus.  Neck: Neck supple. No JVD present. No tracheal deviation present. No thyromegaly present.  Cardiovascular: Normal rate, regular rhythm and normal heart sounds.  Pulmonary/Chest: Effort normal and breath sounds normal. No stridor.  Abdominal: Soft. Bowel sounds are normal. He exhibits no distension. There is no tenderness. There is no rebound.  Lymphadenopathy:    He has no cervical adenopathy.  Neurological: He is alert and oriented to person, place, and time.  Skin: Skin is warm and dry. Rash noted. He is not diaphoretic.  Scattered ca 3-5 mm plaques on back and ant. Legs.   Psychiatric: He has a normal mood and affect. His behavior is  normal.      Assessment & Plan:   Nathaniel LericheMark was seen today for establish care.  Diagnoses and all orders for this visit:  Essential hypertension -     lisinopril (PRINIVIL,ZESTRIL) 20 MG tablet; Take 1 tablet (20 mg total) by mouth daily. -     CBC; Future -     Comprehensive metabolic panel; Future -     TSH; Future -     Urinalysis, Routine w reflex microscopic; Future  Anxiety -     PARoxetine (PAXIL) 40 MG tablet; Take 1 tablet (40 mg total) by mouth every  morning.  Tobacco abuse  Healthcare maintenance -     CBC; Future -     Comprehensive metabolic panel; Future -     HIV antibody; Future -     Lipid panel; Future -     TSH; Future -     Urinalysis, Routine w reflex microscopic; Future  Need for influenza vaccination -     Flu Vaccine QUAD 36+ mos IM   I have discontinued Nathaniel Duran's PARoxetine and hydrochlorothiazide. I have also changed his PARoxetine and lisinopril.  Meds ordered this encounter  Medications  . PARoxetine (PAXIL) 40 MG tablet    Sig: Take 1 tablet (40 mg total) by mouth every morning.    Dispense:  90 tablet    Refill:  1  . lisinopril (PRINIVIL,ZESTRIL) 20 MG tablet    Sig: Take 1 tablet (20 mg total) by mouth daily.    Dispense:  90 tablet    Refill:  1   He will return fasting for his blood work.  Flu shot today.  He will check and record he has blood pressures.  He will return if they start averaging over 140/90.  Patient and his husband are smoking.  I urged him to please stop.  Follow-up: No Follow-up on file.  Mliss SaxWilliam Alfred Kremer, MD

## 2018-09-26 ENCOUNTER — Telehealth: Payer: Self-pay | Admitting: Family Medicine

## 2018-09-26 NOTE — Telephone Encounter (Signed)
Called pt on behalf of Dr Doreene Burke since It has been a while since last visit, left message
